# Patient Record
Sex: Male | Born: 1985 | Race: Black or African American | Hispanic: No | Marital: Single
Health system: Southern US, Community
[De-identification: ages and names within clinical notes are randomized; demographics above are authoritative.]

---

## 2005-05-26 ENCOUNTER — Emergency Department (HOSPITAL_COMMUNITY): Admission: EM | Admit: 2005-05-26 | Discharge: 2005-05-27 | Payer: Self-pay | Admitting: Emergency Medicine

## 2005-07-03 ENCOUNTER — Emergency Department (HOSPITAL_COMMUNITY): Admission: EM | Admit: 2005-07-03 | Discharge: 2005-07-03 | Payer: Self-pay | Admitting: Family Medicine

## 2006-04-15 ENCOUNTER — Emergency Department (HOSPITAL_COMMUNITY): Admission: EM | Admit: 2006-04-15 | Discharge: 2006-04-15 | Payer: Self-pay | Admitting: Family Medicine

## 2007-04-17 IMAGING — CR DG KNEE COMPLETE 4+V*L*
4 series · 4 of 4 positions shown · non-contrast
Comparison: none

CLINICAL DATA: 18-year-old, twisted knee.  
 4-VIEW LEFT KNEE:

[view not recorded (1 of 4)]
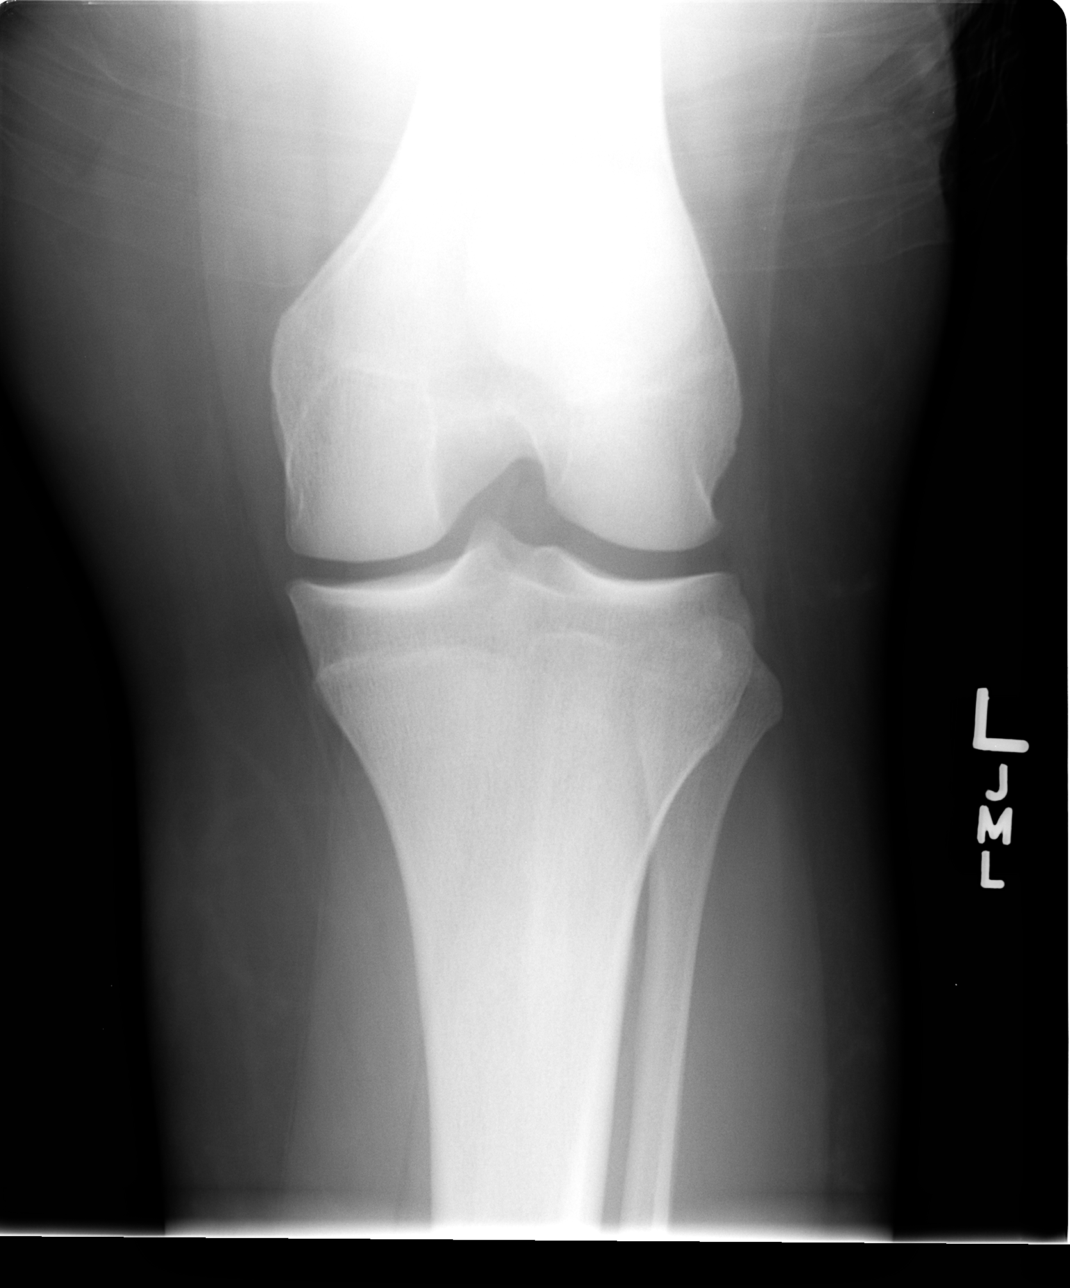

[view not recorded (2 of 4)]
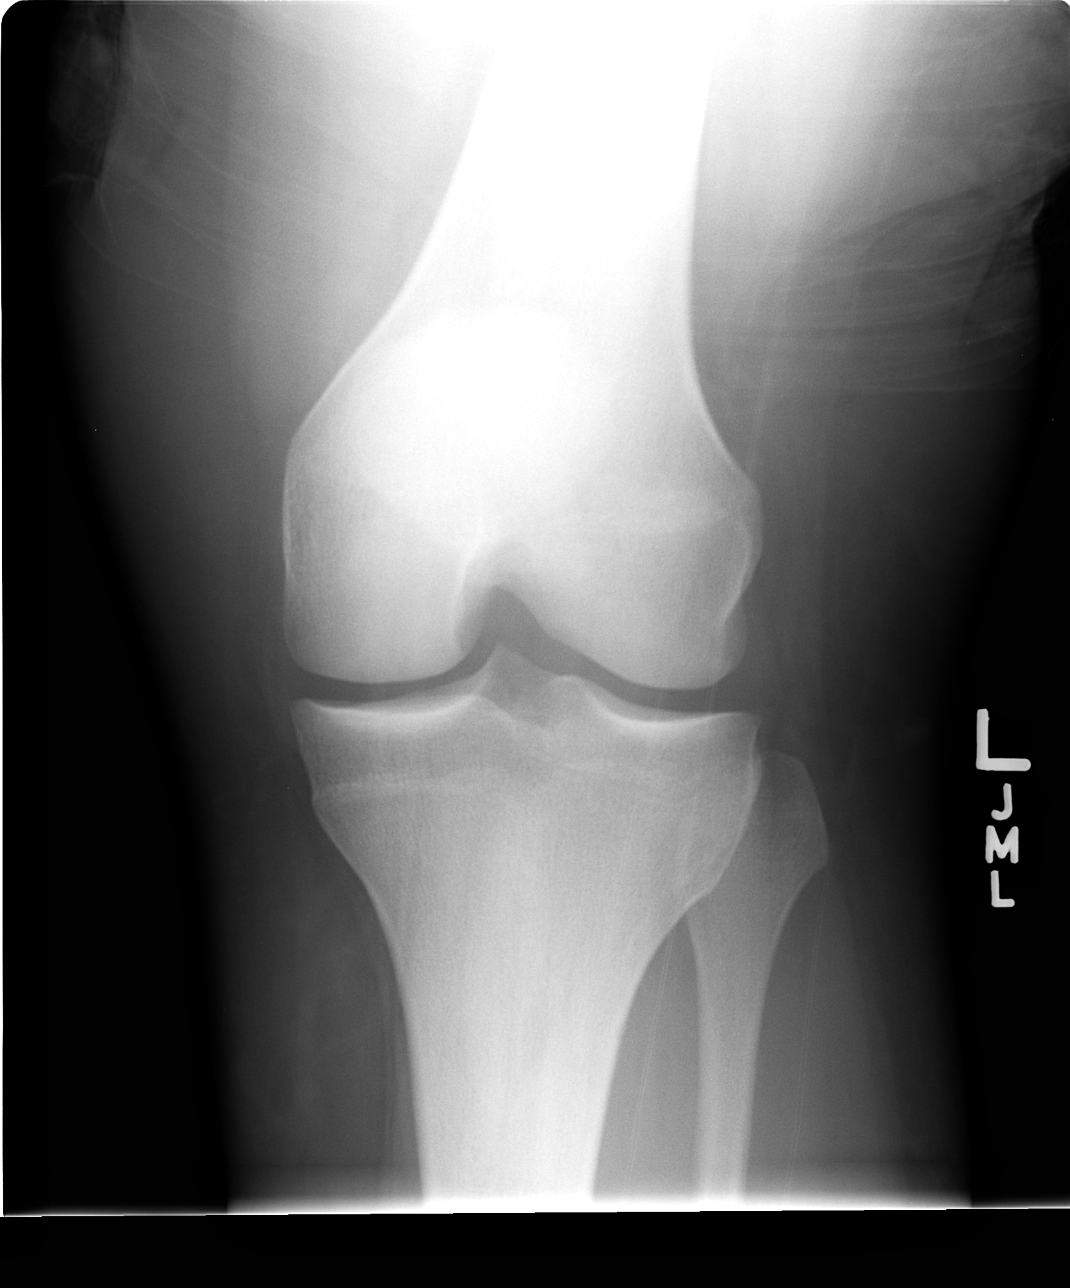

[view not recorded (3 of 4)]
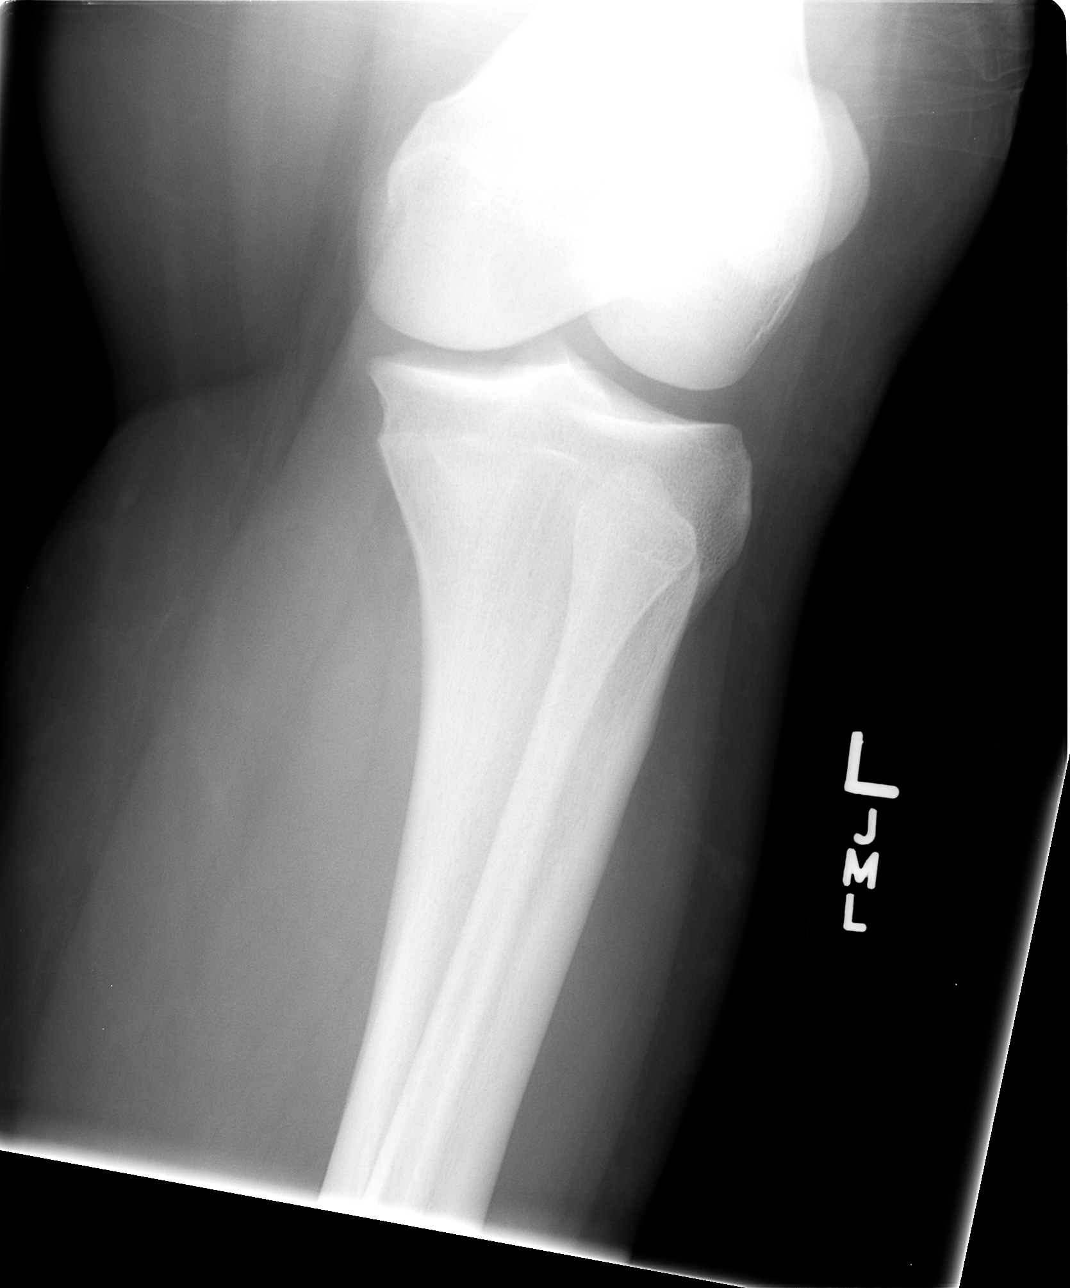

[view not recorded (4 of 4)]
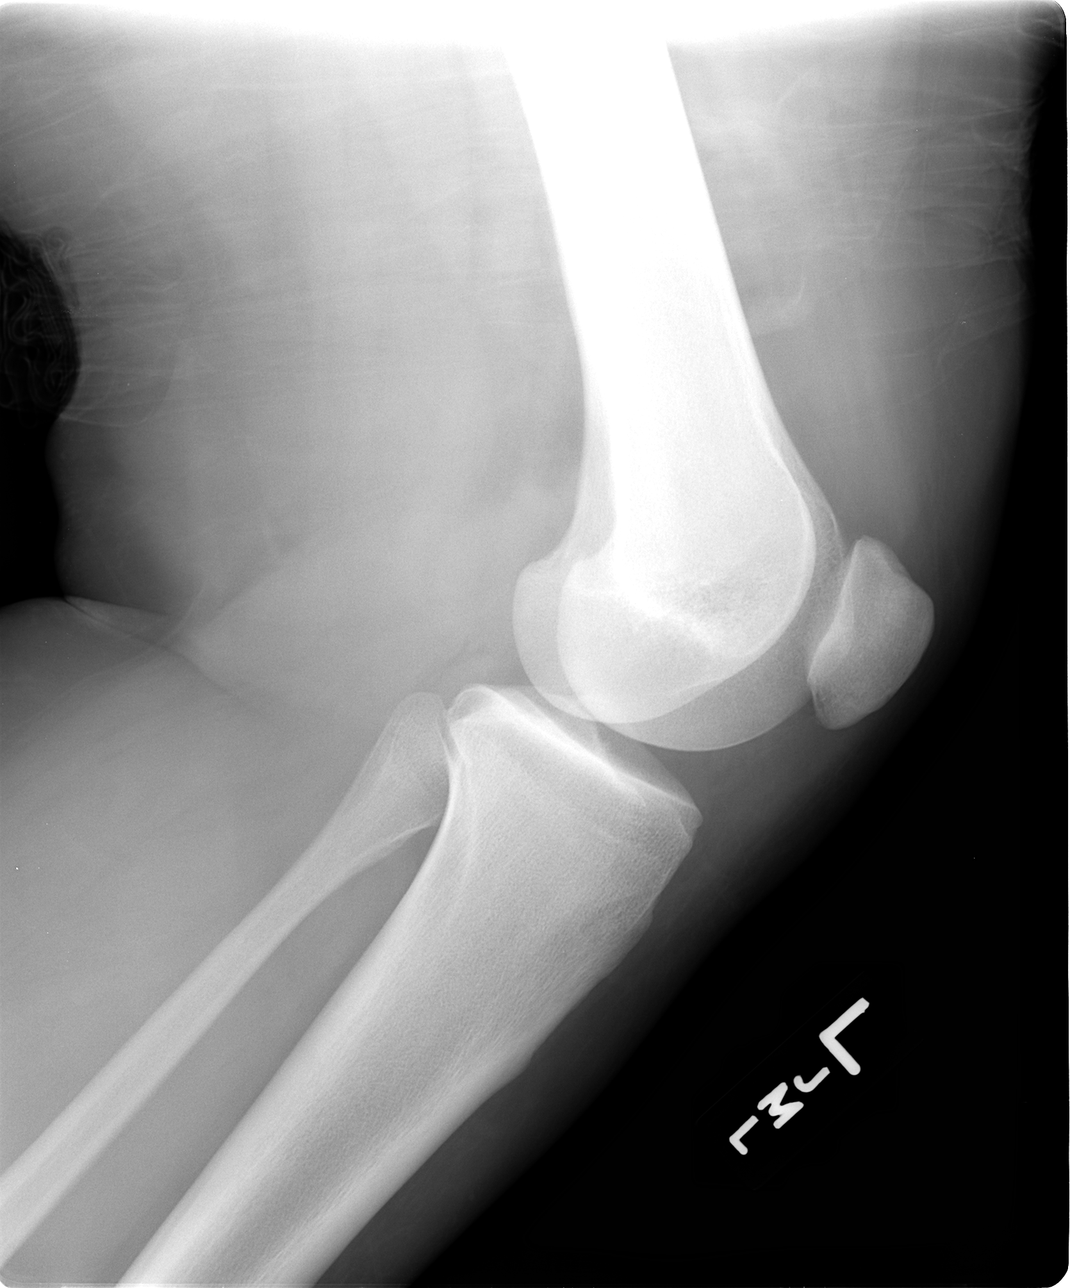

[4 of 4 positions shown; findings below may reference images not displayed]

FINDINGS: Joint spaces are maintained.  No fractures are seen.  Small joint effusion.
IMPRESSION: 1.  No acute bony findings.  
 2.  Small joint effusion.

## 2008-03-24 ENCOUNTER — Emergency Department (HOSPITAL_COMMUNITY): Admission: EM | Admit: 2008-03-24 | Discharge: 2008-03-24 | Payer: Self-pay | Admitting: Emergency Medicine

## 2008-04-07 ENCOUNTER — Emergency Department (HOSPITAL_COMMUNITY): Admission: EM | Admit: 2008-04-07 | Discharge: 2008-04-07 | Payer: Self-pay | Admitting: Family Medicine

## 2008-06-16 ENCOUNTER — Emergency Department (HOSPITAL_COMMUNITY): Admission: EM | Admit: 2008-06-16 | Discharge: 2008-06-16 | Payer: Self-pay | Admitting: Family Medicine

## 2010-01-06 IMAGING — CR DG FINGER MIDDLE 2+V*L*
3 series · 3 of 3 positions shown · non-contrast
Comparison: None

CLINICAL DATA: Laceration PIP region

LEFT MIDDLE FINGER 2+V

[x finger pa left]
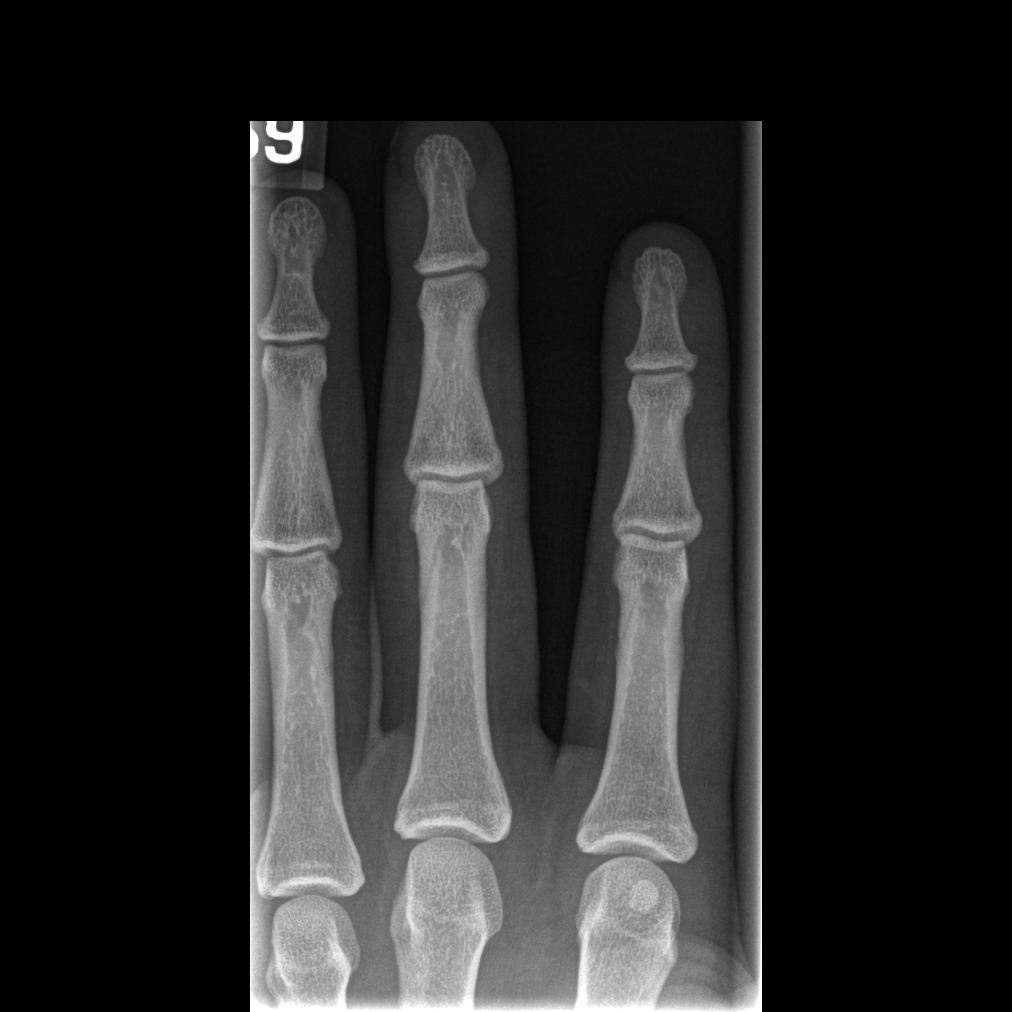

[x finger obl. left]
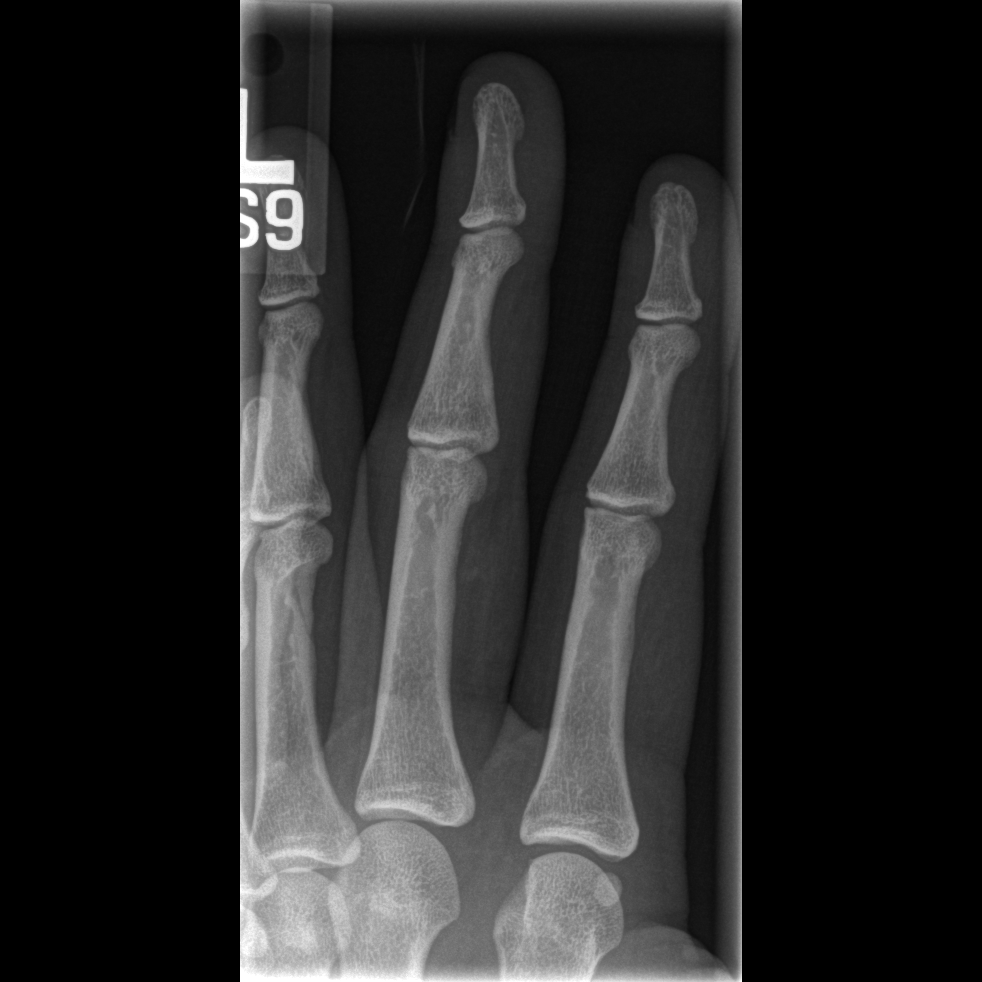

[x finger lateral left]
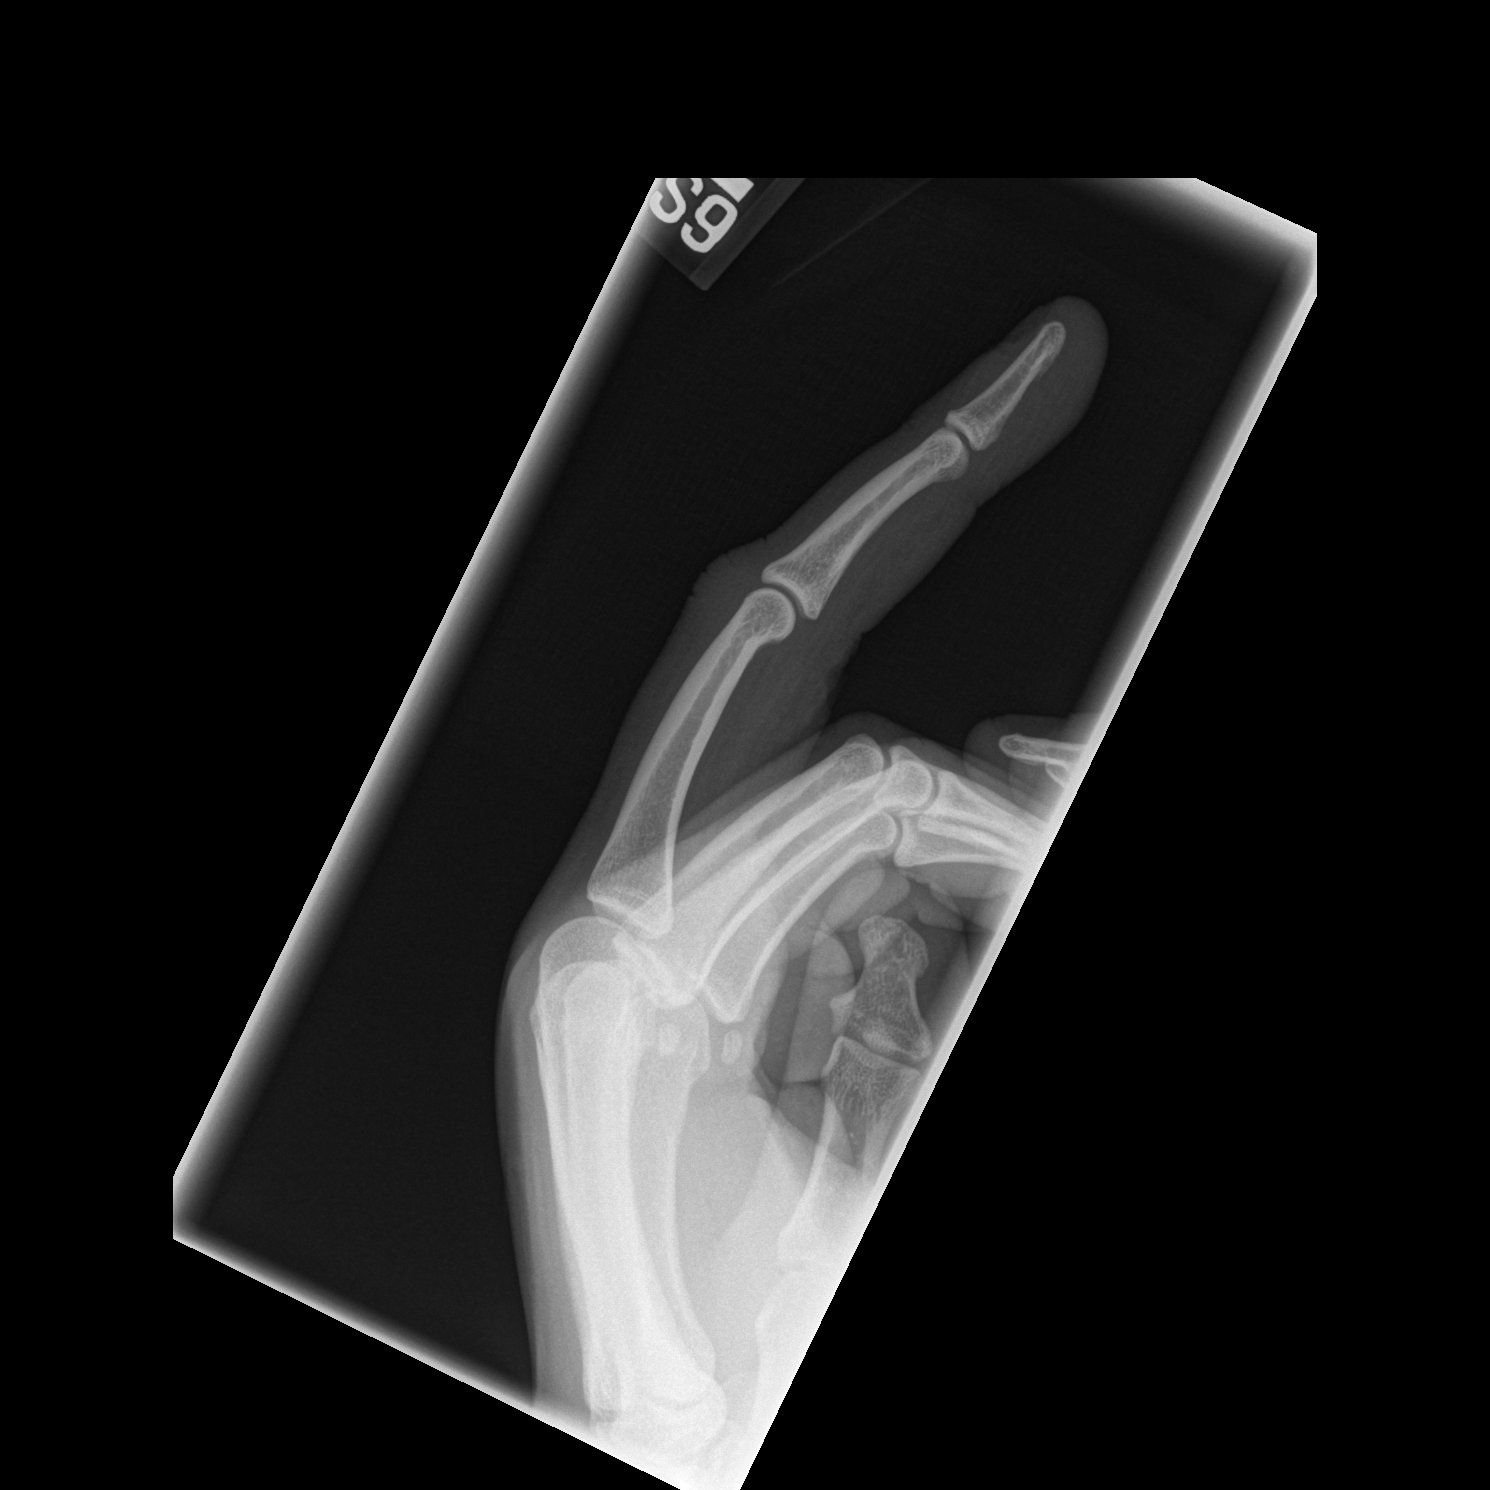

[3 of 3 positions shown; findings below may reference images not displayed]

FINDINGS: No evidence of fracture or radiopaque foreign object
IMPRESSION: Negative radiographs

## 2022-07-24 ENCOUNTER — Ambulatory Visit (LOCAL_COMMUNITY_HEALTH_CENTER): Payer: BC Managed Care – PPO

## 2022-07-24 DIAGNOSIS — Z23 Encounter for immunization: Secondary | ICD-10-CM | POA: Diagnosis not present

## 2022-07-24 DIAGNOSIS — Z719 Counseling, unspecified: Secondary | ICD-10-CM

## 2022-07-24 NOTE — Progress Notes (Signed)
  Are you feeling sick today? No   Have you ever received a dose of COVID-19 Vaccine? [employer], [place], [place], [place], Other) Yes  If yes, which vaccine and how many doses?   MODERNA, 4   Did you bring the vaccination record card or other documentation?  No   Do you have a health condition or are undergoing treatment that makes you moderately or severely immunocompromised? This would include, but not be limited to: cancer, HIV, organ transplant, immunosuppressive therapy/high-dose corticosteroids, or moderate/severe primary immunodeficiency.  No  Have you received COVID-19 vaccine before or during hematopoietic cell transplant (HCT) or CAR-T-cell therapies? No  Have you ever had an allergic reaction to: (This would include a severe allergic reaction or a reaction that caused hives, swelling, or respiratory distress, including wheezing.) A component of a COVID-19 vaccine or a previous dose of COVID-19 vaccine? No   Have you ever had an allergic reaction to another vaccine (other thanCOVID-19 vaccine) or an injectable medication? (This would include a severe allergic reaction or a reaction that caused hives, swelling, or respiratory distress, including wheezing.)   No    Do you have a history of any of the following:  Myocarditis or Pericarditis No  Dermal fillers:  No  Multisystem Inflammatory Syndrome (MIS-C or MIS-A)? No  COVID-19 disease within the past 3 months? No  Vaccinated with monkeypox vaccine in the last 4 weeks? No  Eligible, administered Moderna SpikeVax 12y+. Provided VIS and NCIR copy. Refused post vaccination 15 min observation. M.Jeremiyah Cullens, LPN.
